# Patient Record
Sex: Male | Born: 1985 | Race: Black or African American | Hispanic: No | Marital: Married | Smoking: Light tobacco smoker
Health system: Southern US, Community
[De-identification: ages and names within clinical notes are randomized; demographics above are authoritative.]

---
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
## 2006-12-10 ENCOUNTER — Emergency Department (HOSPITAL_COMMUNITY): Admission: EM | Admit: 2006-12-10 | Discharge: 2006-12-10 | Payer: Self-pay | Admitting: Emergency Medicine

## 2011-07-09 DIAGNOSIS — E559 Vitamin D deficiency, unspecified: Secondary | ICD-10-CM | POA: Insufficient documentation

## 2013-04-28 ENCOUNTER — Ambulatory Visit: Payer: Self-pay | Admitting: Family Medicine

## 2013-04-28 VITALS — BP 116/70 | HR 72 | Temp 98.5°F | Resp 16 | Ht 67.5 in | Wt 233.0 lb

## 2013-04-28 DIAGNOSIS — M545 Low back pain: Secondary | ICD-10-CM

## 2013-04-28 DIAGNOSIS — R51 Headache: Secondary | ICD-10-CM

## 2013-04-28 DIAGNOSIS — Z0289 Encounter for other administrative examinations: Secondary | ICD-10-CM

## 2013-04-28 NOTE — Progress Notes (Signed)
[address]   [place], [place]  [address]   [phone]  Subjective:    Patient ID: Leonard French, male    DOB: 1985/10/04, 27 y.o.   MRN: [id number]  HPI This 27 y.o. male presents for evaluation for pre-employment CPE.  New job with [employer]; needs office notes from all providers.  Low back pain:  S/p ortho consultation; s/p MRI; evaluated by Dr. Shelle Iron; +spondylolisthesis.  No current back pain; complete recovery.  Last back pain in 07/2012.  No follow-up with [facility] recommended.  HA:  Followed by Dr. Neva Seat in the past.  Takes Ibuprofen sparingly for headaches.  Was previously getting headaches once per week; now no headaches.  Would take Ibuprofen two tablets with resolution of headache.  No blurred vision; no photophobia;no phonophobia.  No n/v.  No dizziness.     Review of Systems  Constitutional: Negative for fever, chills, diaphoresis and fatigue.  HENT: Negative for hearing loss and dental problem.   Respiratory: Negative for cough and shortness of breath.   Cardiovascular: Negative for chest pain, palpitations and leg swelling.  Gastrointestinal: Negative for nausea, vomiting, abdominal pain, diarrhea, constipation and rectal pain.  Endocrine: Negative for cold intolerance, heat intolerance, polydipsia, polyphagia and polyuria.  Genitourinary: Negative for frequency, flank pain, penile swelling, scrotal swelling and penile pain.  Musculoskeletal: Negative for myalgias, back pain, joint swelling, arthralgias and gait problem.  Skin: Negative for color change, pallor, rash and wound.  Neurological: Negative for dizziness, tremors, seizures, syncope, speech difficulty, weakness and headaches.  Hematological: Negative for adenopathy.  Psychiatric/Behavioral: Negative for sleep disturbance and dysphoric mood. The patient is not nervous/anxious.     History reviewed. No pertinent past medical history.  History reviewed. No pertinent past surgical history.  Prior to Admission  medications   Not on File    No Known Allergies  History   Social History  . Marital Status: Married    Spouse Name: N/A    Number of Children: N/A  . Years of Education: N/A   Occupational History  . Not on file.   Social History Main Topics  . Smoking status: Never Smoker   . Smokeless tobacco: Not on file  . Alcohol Use: Not on file  . Drug Use: Not on file  . Sexual Activity: Not on file   Other Topics Concern  . Not on file   Social History Narrative   Marital status: married      Children: two girls      Employment:  [employer]       Tobacco:  None      Alcohol: weekends; beer, wine, liquor.  No DWIs.      Drugs: none      Exercise:  Walking, running, weight training; 6 days per week.    Family History  Problem Relation Age of Onset  . Diabetes Maternal Grandmother        Objective:   Physical Exam  Nursing note and vitals reviewed. Constitutional: He is oriented to person, place, and time. He appears well-developed and well-nourished. No distress.  HENT:  Head: Normocephalic and atraumatic.  Right Ear: External ear normal.  Left Ear: External ear normal.  Nose: Nose normal.  Mouth/Throat: Oropharynx is clear and moist.  Eyes: Conjunctivae and EOM are normal. Pupils are equal, round, and reactive to light.  Neck: Normal range of motion. Neck supple. No thyromegaly present.  Cardiovascular: Normal rate, regular rhythm, normal heart sounds and intact distal pulses.  Exam reveals  no gallop and no friction rub.   No murmur heard. Pulmonary/Chest: Effort normal. He has no wheezes. He has no rales.  Abdominal: Soft. Bowel sounds are normal. He exhibits no distension and no mass. There is no tenderness. There is no rebound and no guarding.  Small R umbilical hernia reducible; non-tender.  Musculoskeletal:       Right shoulder: Normal.       Left shoulder: Normal.       Cervical back: Normal.       Thoracic back: Normal.       Lumbar back: Normal.    Lymphadenopathy:    He has no cervical adenopathy.  Neurological: He is alert and oriented to person, place, and time. He has normal reflexes. No cranial nerve deficit. He exhibits normal muscle tone. Coordination normal.  Skin: Skin is warm and dry. No rash noted. He is not diaphoretic. No erythema. No pallor.  Psychiatric: He has a normal mood and affect. His behavior is normal. Judgment and thought content normal.      Assessment & Plan:  Health examination of defined subpopulation  Headache(784.0)  Low back pain   1. Pre-employment CPE:  Clearance.  Normal vision, color vision, hearing.  Normal blood pressure.   2.  Low back pain: resolved; s/p workman's compensation injury 2013; s/p MRI; s/p ortho consultation with [facility]; clearance by ortho.  Normal back exam in office. 3.  Headaches Tension: Resolved; normal neurological exam.  Clearance for employment.

## 2013-05-07 ENCOUNTER — Telehealth: Payer: Self-pay

## 2013-05-07 NOTE — Telephone Encounter (Signed)
Pt was seen recently by dr Katrinka Blazing and was told that he had a hernia and his job is stating that they can not let him work until this has been evaluated and patient would like a call back to discuss this with dr Jason Nest number [phone]

## 2013-05-07 NOTE — Telephone Encounter (Signed)
Clearance to work was given, would this be a contraindication?  He is trying to gain employment at [employer]

## 2013-05-08 NOTE — Telephone Encounter (Signed)
Call -- hernia is not a contraindication to work; thus, I provided clearance.  I am happy to refer him to general surgery for further evaluation if he would like but I have no concerns with him starting employment despite hernia.

## 2013-05-09 NOTE — Telephone Encounter (Signed)
lmom to cb. 

## 2013-05-10 NOTE — Telephone Encounter (Signed)
Pt has appt set up with a general surgery already to have this evaluated.

## 2013-05-13 ENCOUNTER — Encounter (INDEPENDENT_AMBULATORY_CARE_PROVIDER_SITE_OTHER): Payer: Self-pay

## 2013-05-13 ENCOUNTER — Encounter (INDEPENDENT_AMBULATORY_CARE_PROVIDER_SITE_OTHER): Payer: Self-pay | Admitting: General Surgery

## 2013-05-13 ENCOUNTER — Ambulatory Visit (INDEPENDENT_AMBULATORY_CARE_PROVIDER_SITE_OTHER): Payer: Self-pay | Admitting: General Surgery

## 2013-05-13 VITALS — BP 138/88 | HR 80 | Temp 97.9°F | Resp 16 | Ht 66.0 in | Wt 236.4 lb

## 2013-05-13 DIAGNOSIS — K429 Umbilical hernia without obstruction or gangrene: Secondary | ICD-10-CM

## 2013-05-13 NOTE — Patient Instructions (Signed)
You have some asymmetry to the skin and subcutaneous tissue to the right of your umbilicus You technically have an umbilical hernia but it is VERY tiny and I donot recommend repair at this time Can continue normal activities Work on weight loss

## 2013-05-13 NOTE — Progress Notes (Signed)
Patient ID: Leonard French, male   DOB: [birth year], 27 y.o.   MRN: [id number]  Chief Complaint  Patient presents with  . Umbilical Hernia    new pt    HPI Leonard French is a 27 y.o. male.   HPI 27 yo AAM referred by Dr Nilda Simmer for evaluation of possible umbilical hernia. Patient saw Dr. Katrinka Blazing for a preemployment physical. On exam it was felt that he may have an umbilical hernia just to the right of the umbilicus. He denies any abdominal pain. He denies any fever, chills, nausea, vomiting, diarrhea or constipation. He denies ever noticing a protrusion or lump at his umbilicus. He has never had any prior abdominal surgery  History reviewed. No pertinent past medical history.  History reviewed. No pertinent past surgical history.  Family History  Problem Relation Age of Onset  . Diabetes Maternal Grandmother     Social History History  Substance Use Topics  . Smoking status: Never Smoker   . Smokeless tobacco: Not on file  . Alcohol Use: Yes    No Known Allergies  Current Outpatient Prescriptions  Medication Sig Dispense Refill  . sulfamethoxazole-trimethoprim (BACTRIM DS) 800-160 MG per tablet Take 1 tablet by mouth 2 (two) times daily.       No current facility-administered medications for this visit.    Review of Systems Review of Systems  Constitutional: Negative for fever, chills, appetite change and unexpected weight change.  HENT: Negative for congestion and trouble swallowing.        Takes a preventative abx b/c of h/o scalp infection  Eyes: Negative for visual disturbance.  Respiratory: Negative for chest tightness and shortness of breath.   Cardiovascular: Negative for chest pain and leg swelling.       No PND, no orthopnea, no DOE  Gastrointestinal: Negative for nausea, vomiting, abdominal pain, diarrhea and constipation.       See HPI  Genitourinary: Negative for dysuria and hematuria.  Musculoskeletal: Negative.   Skin: Negative for rash.  Neurological:  Negative for seizures and speech difficulty.  Hematological: Does not bruise/bleed easily.  Psychiatric/Behavioral: Negative for behavioral problems and confusion.    Blood pressure 138/88, pulse 80, temperature 97.9 F (36.6 C), temperature source Temporal, resp. rate 16, height 5\' 6"  (1.676 m), weight 236 lb 6.4 oz (107.23 kg).  Physical Exam Physical Exam  Vitals reviewed. Constitutional: He is oriented to person, place, and time. He appears well-developed and well-nourished. No distress.  obese  HENT:  Head: Normocephalic and atraumatic.  Right Ear: External ear normal.  Left Ear: External ear normal.  Eyes: Conjunctivae are normal. No scleral icterus.  Neck: Normal range of motion. Neck supple. No tracheal deviation present. No thyromegaly present.  Cardiovascular: Normal rate, normal heart sounds and intact distal pulses.   Pulmonary/Chest: Effort normal and breath sounds normal. No respiratory distress. He has no wheezes.  Abdominal: Soft. He exhibits no distension. There is no tenderness. There is no rebound and no guarding.  Has a slight asymmetry just to right of umbilicus in subcu tissue; no hernia or fascial defect in this area. Has about a 3-74mm fascial defect at base of umbilical stalk but nothing protruding. Nontender. Examined supine and standing.   Musculoskeletal: Normal range of motion. He exhibits no edema and no tenderness.  Lymphadenopathy:    He has no cervical adenopathy.  Neurological: He is alert and oriented to person, place, and time. He exhibits normal muscle tone.  Skin: Skin is warm and dry. No  rash noted. He is not diaphoretic. No erythema. No pallor.  Psychiatric: He has a normal mood and affect. His behavior is normal. Judgment and thought content normal.    Data Reviewed Dr Nilda Simmer note  Assessment    Small umbilical hernia     Plan    To the right of his umbilicus he has a small amount of asymmetry in the skin and subcutaneous tissue  however there is no fascial defect in this area. The patient was examined standing and supine. He does have a very very small fascial defect at the base of his umbilical stalk. However the defect is only about 3-4 mm. I do not recommend repair since it is so small and he is asymptomatic. However we did talk about the importance of weight loss. I do not believe this tiny fascial defect will impose any restrictions for him. Followup as needed  Mary Sella. Andrey Campanile, MD, FACS General, Bariatric, & Minimally Invasive Surgery [facility], [place]        [facility] M 05/13/2013, 9:23 AM

## 2013-05-19 ENCOUNTER — Telehealth (INDEPENDENT_AMBULATORY_CARE_PROVIDER_SITE_OTHER): Payer: Self-pay | Admitting: *Deleted

## 2013-05-19 NOTE — Telephone Encounter (Signed)
Patient called asking to speak with Leonard French only.  Explained that I would send her a message and ask her to return his call.  Patient states understanding.

## 2013-05-19 NOTE — Telephone Encounter (Signed)
i spoke with pt about because he insisted on speaking with me when in fact it was [employer]. Who he had been speaking with about forms..he did not have surgery but paid $20 for some forms that EW just needs to check off if pt can perform the job duties or not

## 2014-06-25 ENCOUNTER — Ambulatory Visit (INDEPENDENT_AMBULATORY_CARE_PROVIDER_SITE_OTHER): Payer: Federal, State, Local not specified - PPO | Admitting: Physician Assistant

## 2014-06-25 VITALS — BP 110/58 | HR 96 | Temp 101.0°F | Resp 19 | Ht 67.0 in | Wt 206.2 lb

## 2014-06-25 DIAGNOSIS — R509 Fever, unspecified: Secondary | ICD-10-CM

## 2014-06-25 DIAGNOSIS — R05 Cough: Secondary | ICD-10-CM

## 2014-06-25 DIAGNOSIS — R059 Cough, unspecified: Secondary | ICD-10-CM

## 2014-06-25 MED ORDER — ALBUTEROL SULFATE HFA 108 (90 BASE) MCG/ACT IN AERS: 2.0000 | INHALATION_SPRAY | RESPIRATORY_TRACT | Status: DC | PRN

## 2014-06-25 MED ORDER — AZITHROMYCIN 250 MG PO TABS: ORAL_TABLET | ORAL | Status: DC

## 2014-06-25 MED ORDER — HYDROCOD POLST-CHLORPHEN POLST 10-8 MG/5ML PO LQCR: 5.0000 mL | Freq: Two times a day (BID) | ORAL | Status: DC | PRN

## 2014-06-25 NOTE — Progress Notes (Signed)
Subjective:    Patient ID: Leonard French, male    DOB: [birth year], 28 y.o.   MRN: [id number] There are no active problems to display for this patient.  Prior to Admission medications   Medication Sig Start Date End Date Taking? Authorizing Provider  Dextromethorphan-Guaifenesin [facility](MUCINEX DM PO) Take by mouth.   Yes Historical Provider, MD  guaiFENesin (ROBITUSSIN) 100 MG/5ML liquid Take 200 mg by mouth 3 (three) times daily as needed for cough.   Yes Historical Provider, MD   No Known Allergies  HPI  This is a 28 year old male presenting with 10 days of fever, chills, cough and rhinorrhea. He initially had a sore throat but this has resolved. He reports his cough is productive and he is having a hard time sleeping because of coughing. He has continued to work through his illness. He has tried tylenol, mucinex and robitussin DM all without much relief. His last dose of tylenol was over 24 hours ago. He denies otalgia, sinus pressure, myalgias, wheezing or SOB. He is not a smoker and does not have a history of asthma. He has not had the flu vaccine this year.  Patient's social and family history were reviewed.  Review of Systems  Constitutional: Positive for fever and chills.  HENT: Positive for rhinorrhea and sore throat. Negative for ear pain and sinus pressure.   Eyes: Negative.   Respiratory: Positive for cough. Negative for shortness of breath and wheezing.   Gastrointestinal: Negative for nausea, vomiting and diarrhea.  Musculoskeletal: Negative for myalgias.  Skin: Negative for rash.  Neurological: Negative for dizziness.      Objective:   Physical Exam  Constitutional: He is oriented to person, place, and time. He appears well-developed and well-nourished. No distress.  HENT:  Head: Normocephalic and atraumatic.  Right Ear: Hearing, tympanic membrane, external ear and ear canal normal.  Left Ear: Hearing, tympanic membrane, external ear and ear canal normal.  Nose: Mucosal  edema present. Right sinus exhibits no maxillary sinus tenderness and no frontal sinus tenderness. Left sinus exhibits no maxillary sinus tenderness and no frontal sinus tenderness.  Mouth/Throat: Uvula is midline and mucous membranes are normal. Posterior oropharyngeal erythema present. No oropharyngeal exudate.  Eyes: Conjunctivae and lids are normal. Right eye exhibits no discharge. Left eye exhibits no discharge. No scleral icterus.  Cardiovascular: Normal rate, regular rhythm and normal pulses.   s2 splitting  Pulmonary/Chest: Effort normal. No respiratory distress. He has wheezes (inspiratory, all fields). He has no rhonchi. He has no rales.  Musculoskeletal: Normal range of motion.  Lymphadenopathy:       Head (right side): No submental, no submandibular, no tonsillar, no preauricular, no posterior auricular and no occipital adenopathy present.       Head (left side): No submental, no submandibular, no tonsillar, no preauricular, no posterior auricular and no occipital adenopathy present.    He has no cervical adenopathy.  Neurological: He is alert and oriented to person, place, and time.  Skin: Skin is warm, dry and intact. No lesion and no rash noted.  Psychiatric: He has a normal mood and affect. His speech is normal and behavior is normal. Thought content normal.   BP 110/58 mmHg  Pulse 96  Temp(Src) 101 F (38.3 C) (Oral)  Resp 19  Ht 5\' 7"  (1.702 m)  Wt 206 lb 3.2 oz (93.532 kg)  BMI 32.29 kg/m2  SpO2 96%     Assessment & Plan:  1. Cough 2. Fever, unspecified fever cause Due  to productive cough and fever x 10 days (fever today of 101) will treat for bacterial cause with zithromax. Due to wheezing, will give albuterol - counseling on medication given. He may take tussionex for cough suppression and sleep. He will call if still having fevers in 3 days.  - chlorpheniramine-HYDROcodone (TUSSIONEX PENNKINETIC ER) 10-8 MG/5ML LQCR; Take 5 mLs by mouth every 12 (twelve) hours as  needed for cough (cough).  Dispense: 100 mL; Refill: 0 - albuterol (PROVENTIL HFA;VENTOLIN HFA) 108 (90 BASE) MCG/ACT inhaler; Inhale 2 puffs into the lungs every 4 (four) hours as needed for wheezing or shortness of breath (cough, shortness of breath or wheezing.).  Dispense: 1 Inhaler; Refill: 1 - azithromycin (ZITHROMAX) 250 MG tablet; Take 2 tabs PO x 1 dose, then 1 tab PO QD x 4 days  Dispense: 6 tablet; Refill: 0   Roswell MinersNicole V. Dyke BrackettBush, PA-C, MHS [facility]  06/25/2014

## 2014-06-25 NOTE — Patient Instructions (Signed)
Drink plenty of fluids and get plenty of rest. If still having fevers in 3 days, call the office. If other cold symptoms not improving in 7 days, return to clinic.

## 2014-06-26 NOTE — Progress Notes (Signed)
I was directly involved with the patient's care and agree with the diagnosis and treatment plan.  

## 2015-09-21 ENCOUNTER — Ambulatory Visit (INDEPENDENT_AMBULATORY_CARE_PROVIDER_SITE_OTHER): Payer: Self-pay | Admitting: Family Medicine

## 2015-09-21 VITALS — BP 118/80 | HR 85 | Temp 98.6°F | Resp 18 | Ht 68.0 in | Wt 228.0 lb

## 2015-09-21 DIAGNOSIS — Z025 Encounter for examination for participation in sport: Secondary | ICD-10-CM

## 2015-09-21 DIAGNOSIS — Z029 Encounter for administrative examinations, unspecified: Secondary | ICD-10-CM

## 2015-09-21 NOTE — Patient Instructions (Signed)
No concerns on your exam today.  If any recurrence of wheezing or shortness of breath - return here or other medical provider.

## 2015-09-21 NOTE — Progress Notes (Addendum)
Subjective:  By signing my name below, I, Leonard French, attest that this documentation has been prepared under the direction and in the presence of Leonard Staggers, MD.  Leonard French, Medical Scribe. 09/21/2015.  8:33 AM.   Patient ID: Leonard French, male    DOB: [birth year], 30 y.o.   MRN: [id number]  Chief Complaint  Patient presents with  . Annual Exam    Has paperwork with him    HPI HPI Comments: Leonard French is a 30 y.o. male who presents to [facility] for a complete physical exam with associated paperwork for [facility] department.  Pt reports no medical problems, and indicates that he is currently on no medications. Pt was advised previously that he has severe allergies for which he was prescribed an albuterol inhaler in 2015, however he no longer is taking this. He notes no diagnosed history of asthma.  Pt has never been hospitalized previously. Pt denies shortness of breath with exacerbation.   Pt used to work with [employer], and the [employer] after. He is currently employed with [employer].   There are no active problems to display for this patient.  No past medical history on file. No past surgical history on file. No Known Allergies Prior to Admission medications   Not on File   Social History   Social History  . Marital Status: Married    Spouse Name: N/A  . Number of Children: N/A  . Years of Education: N/A   Occupational History  . Not on file.   Social History Main Topics  . Smoking status: Never Smoker   . Smokeless tobacco: Not on file  . Alcohol Use: Yes  . Drug Use: No  . Sexual Activity: Not on file   Other Topics Concern  . Not on file   Social History Narrative   Marital status: married      Children: two girls      Employment:  [employer]       Tobacco:  None      Alcohol: weekends; beer, wine, liquor.  No DWIs.      Drugs: none      Exercise:  Walking, running, weight  training; 6 days per week.    Review of Systems  Constitutional: Negative for fever.  Respiratory: Negative for shortness of breath.   Allergic/Immunologic: Positive for environmental allergies.   No acute concerns    Objective:   Physical Exam  Constitutional: He is oriented to person, place, and time. He appears well-developed and well-nourished. No distress.  HENT:  Head: Normocephalic and atraumatic.  Right Ear: External ear normal.  Left Ear: External ear normal.  Mouth/Throat: Oropharynx is clear and moist.  Eyes: Conjunctivae and EOM are normal. Pupils are equal, round, and reactive to light.  Neck: Normal range of motion. Neck supple. No thyromegaly present.  Cardiovascular: Normal rate, regular rhythm, normal heart sounds and intact distal pulses.   Pulmonary/Chest: Effort normal and breath sounds normal. No respiratory distress. He has no wheezes.  Abdominal: Soft. He exhibits no distension. There is no tenderness. Hernia confirmed negative in the right inguinal area and confirmed negative in the left inguinal area.  Musculoskeletal: Normal range of motion. He exhibits no edema or tenderness.  Lymphadenopathy:    He has no cervical adenopathy.  Neurological: He is alert and oriented to person, place, and time. He has normal reflexes. No cranial nerve deficit.  Skin: Skin is warm and  dry.  Psychiatric: He has a normal mood and affect. His behavior is normal.  Nursing note and vitals reviewed.   Filed Vitals:   09/21/15 0812  BP: 118/80  Pulse: 85  Temp: 98.6 F (37 C)  TempSrc: Oral  Resp: 18  Height:  (1.727 m)  Weight: 228 lb (103.42 kg)  SpO2: 98%      Assessment & Plan:  Leonard French is a 30 y.o. 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 No concerns on exams or hx.  Reactive bronchospasm prior, but no hx of asthma.  rtc precautions if wheezing returns.   -ppwk completed.   -declined flu vaccine.   No orders of the defined types were  placed in this encounter.   Patient Instructions  No concerns on your exam today.  If any recurrence of wheezing or shortness of breath - return here or other medical provider.     I personally performed the services described in this documentation, which was scribed in my presence. The recorded information has been reviewed and considered, and addended by me as needed.

## 2015-12-11 ENCOUNTER — Ambulatory Visit (INDEPENDENT_AMBULATORY_CARE_PROVIDER_SITE_OTHER): Payer: Self-pay | Admitting: Family Medicine

## 2015-12-11 VITALS — BP 132/78 | HR 88 | Temp 98.6°F | Resp 16 | Ht 67.25 in | Wt 232.4 lb

## 2015-12-11 DIAGNOSIS — Z0000 Encounter for general adult medical examination without abnormal findings: Secondary | ICD-10-CM

## 2015-12-11 DIAGNOSIS — Z029 Encounter for administrative examinations, unspecified: Secondary | ICD-10-CM

## 2015-12-11 LAB — POCT URINALYSIS DIP (MANUAL ENTRY)
Bilirubin, UA: NEGATIVE
Blood, UA: NEGATIVE
Glucose, UA: NEGATIVE
Ketones, POC UA: NEGATIVE
Leukocytes, UA: NEGATIVE
Nitrite, UA: NEGATIVE
Spec Grav, UA: 1.02
Urobilinogen, UA: 0.2
pH, UA: 6

## 2015-12-11 NOTE — Progress Notes (Signed)
30 year old man who has worked in [employer] and now is looking for job in [employer] and [employer].  His only medical issue is seasonal allergies and he takes over-the-counter medicine for that.  Objective: Cc forms. Other than being overweight, patient has no physical abnormalities  Assessment: Patient passes his screening for work in the [employer] Annual physical exam - Plan: POCT urinalysis dipstick, TB Skin Test   Signed, Sheila OatsKurt Jarman Litton M.D.

## 2015-12-11 NOTE — Patient Instructions (Addendum)

## 2015-12-11 NOTE — Progress Notes (Signed)
  Tuberculosis Risk Questionnaire  1. No Were you born outside the [place] in one of the following parts of the world: [place], [place], [place], [place] or [place]?    2. No Have you traveled outside the [place] and lived for more than one month in one of the following parts of the world: [place], [place], [place], [place] or [place]?    3. No Do you have a compromised immune system such as from any of the following conditions:HIV/AIDS, organ or bone marrow transplantation, diabetes, immunosuppressive medicines (e.g. Prednisone, Remicaide), leukemia, lymphoma, cancer of the head or neck, gastrectomy or jejunal bypass, end-stage renal disease (on dialysis), or silicosis?     4. No Have you ever or do you plan on working in: a residential care center, a health care facility, a jail or prison or homeless shelter?    5. No Have you ever: injected illegal drugs, used crack cocaine, lived in a homeless shelter  or been in jail or prison?     6. No Have you ever been exposed to anyone with infectious tuberculosis?    Tuberculosis Symptom Questionnaire  Do you currently have any of the following symptoms?  1. No Unexplained cough lasting more than 3 weeks?   2. No Unexplained fever lasting more than 3 weeks.   3. No Night Sweats (sweating that leaves the bedclothes and sheets wet)     4. No Shortness of Breath   5. No Chest Pain   6. No Unintentional weight loss    7. No Unexplained fatigue (very tired for no reason)    Elvina SidleKurt Amardeep Beckers, MD

## 2015-12-13 ENCOUNTER — Ambulatory Visit: Payer: Self-pay | Admitting: Physician Assistant

## 2015-12-13 LAB — TB SKIN TEST
Induration: 0 mm
TB Skin Test: NEGATIVE

## 2015-12-13 NOTE — Progress Notes (Unsigned)
Patient presented today from TB reading. TB reading was negative with 0mm duration. Jp./cma

## 2016-09-05 ENCOUNTER — Ambulatory Visit: Payer: Self-pay | Admitting: Family Medicine

## 2017-05-09 ENCOUNTER — Emergency Department (HOSPITAL_COMMUNITY)
Admission: EM | Admit: 2017-05-09 | Discharge: 2017-05-09 | Disposition: A | Discharge status: Home / Self Care | Payer: BLUE CROSS/BLUE SHIELD | Attending: Emergency Medicine | Admitting: Emergency Medicine

## 2017-05-09 ENCOUNTER — Encounter (HOSPITAL_COMMUNITY): Payer: Self-pay | Admitting: Emergency Medicine

## 2017-05-09 ENCOUNTER — Emergency Department (HOSPITAL_COMMUNITY): Payer: BLUE CROSS/BLUE SHIELD

## 2017-05-09 DIAGNOSIS — R0789 Other chest pain: Secondary | ICD-10-CM | POA: Diagnosis not present

## 2017-05-09 DIAGNOSIS — R1011 Right upper quadrant pain: Secondary | ICD-10-CM | POA: Insufficient documentation

## 2017-05-09 DIAGNOSIS — R0781 Pleurodynia: Secondary | ICD-10-CM

## 2017-05-09 LAB — COMPREHENSIVE METABOLIC PANEL
ALBUMIN: 4.5 g/dL (ref 3.5–5.0)
ALK PHOS: 50 U/L (ref 38–126)
ALT: 20 U/L (ref 17–63)
AST: 23 U/L (ref 15–41)
Anion gap: 14 (ref 5–15)
BILIRUBIN TOTAL: 1.7 mg/dL — AB (ref 0.3–1.2)
BUN: 10 mg/dL (ref 6–20)
CALCIUM: 9.5 mg/dL (ref 8.9–10.3)
CO2: 23 mmol/L (ref 22–32)
Chloride: 101 mmol/L (ref 101–111)
Creatinine, Ser: 0.81 mg/dL (ref 0.61–1.24)
GFR calc Af Amer: >60 mL/min (ref >60)
GFR calc non Af Amer: >60 mL/min (ref >60)
GLUCOSE: 92 mg/dL (ref 65–99)
Potassium: 3.9 mmol/L (ref 3.5–5.1)
SODIUM: 138 mmol/L (ref 135–145)
TOTAL PROTEIN: 8.3 g/dL — AB (ref 6.5–8.1)

## 2017-05-09 LAB — CBC
HEMATOCRIT: 43.4 % (ref 39.0–52.0)
Hemoglobin: 15.5 g/dL (ref 13.0–17.0)
MCH: 30.8 pg (ref 26.0–34.0)
MCHC: 35.7 g/dL (ref 30.0–36.0)
MCV: 86.3 fL (ref 78.0–100.0)
Platelets: 238 10*3/uL (ref 150–400)
RBC: 5.03 MIL/uL (ref 4.22–5.81)
RDW: 12.4 % (ref 11.5–15.5)
WBC: 10.1 10*3/uL (ref 4.0–10.5)

## 2017-05-09 LAB — LIPASE, BLOOD: LIPASE: 56 U/L — AB (ref 11–51)

## 2017-05-09 MED ORDER — ALBUTEROL SULFATE HFA 108 (90 BASE) MCG/ACT IN AERS: 2.0000 | INHALATION_SPRAY | RESPIRATORY_TRACT | 0 refills | Status: AC | PRN

## 2017-05-09 MED ORDER — IBUPROFEN 800 MG PO TABS: 800.0000 mg | ORAL_TABLET | Freq: Three times a day (TID) | ORAL | 0 refills | Status: AC

## 2017-05-09 MED ORDER — IBUPROFEN 800 MG PO TABS
800.0000 mg | ORAL_TABLET | Freq: Once | ORAL | Status: AC
  Administered 2017-05-09: 800 mg via ORAL
  Filled 2017-05-09: qty 1

## 2017-05-09 NOTE — Discharge Instructions (Signed)
You can take 800 mg of ibuprofen once every 8 hours with food to help with your pain. You can apply ice for the first 48 hours and then after that period you can apply heat to help with pain. The albuterol inhaler can taken with 2 puffs every 4 hours as needed for shortness of breath, just as were prescribed previously.   If you develop new or worsening symptoms, including fever, if the rash appears over the area, or if you develop nausea and vomiting with worsening pain, please return for re-evaluation.

## 2017-05-09 NOTE — ED Provider Notes (Signed)
[facility]-EMERGENCY DEPT Provider Note   CSN: [id number] Arrival date & time: 05/09/17  4098     History   Chief Complaint Chief Complaint  Patient presents with  . rib cage pain    HPI Leonard French is a 31 y.o. male who presents to the emergency department with a chief complaint of right upper quadrant pain and right antero lateral chest pain that radiates to the right side of his mid-back that began yesterday. He states his symptoms are aggravated with taking deep breaths, laying on his back, and twisting his torso. No alleviating factors. He denies chest pain, dyspnea, palpitations, fever, chills, nausea, vomiting, or diarrhea. No dysuria, frequency, or hesitancy. No recent trauma or injury. No history of similar symptoms. No treatment prior to arrival.  The history is provided by the patient. No language interpreter was used.    History reviewed. No pertinent past medical history.  There are no active problems to display for this patient.   History reviewed. No pertinent surgical history.     Home Medications    Prior to Admission medications   Medication Sig Start Date End Date Taking? Authorizing Provider  albuterol (PROVENTIL HFA;VENTOLIN HFA) 108 (90 Base) MCG/ACT inhaler Inhale 2 puffs into the lungs every 4 (four) hours as needed for wheezing or shortness of breath. 05/09/17   Kobe Jansma A, PA-C  ibuprofen (ADVIL,MOTRIN) 800 MG tablet Take 1 tablet (800 mg total) by mouth 3 (three) times daily. 05/09/17   Teja Costen A, PA-C    Family History Family History  Problem Relation Age of Onset  . Diabetes Maternal Grandmother     Social History Social History  Substance Use Topics  . Smoking status: Never Smoker  . Smokeless tobacco: Not on file  . Alcohol use Yes     Allergies   Patient has no known allergies.   Review of Systems Review of Systems  Constitutional: Negative for activity change, chills and fever.  Respiratory: Negative for shortness of  breath.   Cardiovascular: Positive for chest pain.  Gastrointestinal: Positive for abdominal pain. Negative for abdominal distention, diarrhea, nausea and vomiting.  Musculoskeletal: Negative for back pain.  Skin: Negative for rash.  Allergic/Immunologic: Negative for immunocompromised state.   Physical Exam Updated Vital Signs BP 132/81 (BP Location: Left Arm)   Pulse 72   Temp 98.5 F (36.9 C) (Oral)   Resp 18   SpO2 98%   Physical Exam  Constitutional: He appears well-developed.  HENT:  Head: Normocephalic.  Eyes: Conjunctivae are normal.  Neck: Neck supple.  Cardiovascular: Normal rate, regular rhythm and normal heart sounds.  Exam reveals no gallop and no friction rub.   No murmur heard. Pulmonary/Chest: Effort normal and breath sounds normal. No respiratory distress. He has no wheezes. He has no rales. He exhibits tenderness.  TTP over the right anterior and lateral chest wall. No focal tenderness to palpation of the ribs.   Abdominal: Soft. Bowel sounds are normal. He exhibits no distension and no mass. There is tenderness. There is no rebound and no guarding. No hernia.  TTP in the right upper quadrant. No CVA tenderness bilaterally. No RLQ or left-sided abdominal pain.   Musculoskeletal: He exhibits tenderness. He exhibits no edema or deformity.  TTP over the the right paraspinal muscles of the thoracic spine. No left-sided tenderness. No tenderness to palpation of the cervical, thoracic, or lumbar spinous processes.  Neurological: He is alert.  Skin: Skin is warm and dry.  Psychiatric: His behavior is  normal.  Nursing note and vitals reviewed.    ED Treatments / Results  Labs (all labs ordered are listed, but only abnormal results are displayed) Labs Reviewed  COMPREHENSIVE METABOLIC PANEL - Abnormal; Notable for the following:       Result Value   Total Protein 8.3 (*)    Total Bilirubin 1.7 (*)    All other components within normal limits  LIPASE, BLOOD -  Abnormal; Notable for the following:    Lipase 56 (*)    All other components within normal limits  CBC    EKG  EKG Interpretation None       Radiology Dg Chest 2 View  Result Date: 05/09/2017 CLINICAL DATA:  31 year old male with a history of lower anterior rib pain EXAM: CHEST  2 VIEW COMPARISON:  None. FINDINGS: Cardiomediastinal silhouette within normal limits. No evidence of central vascular congestion. Low lung volumes with crowding of the interstitium. No pleural effusion or pneumothorax. No acute fracture. IMPRESSION: Low lung volumes with no evidence of acute cardiopulmonary disease Electronically Signed   By: Gilmer Mor D.O.   On: 05/09/2017 10:04    Procedures Procedures (including critical care time)  Medications Ordered in ED Medications  ibuprofen (ADVIL,MOTRIN) tablet 800 mg (800 mg Oral Given 05/09/17 1130)     Initial Impression / Assessment and Plan / ED Course  I have reviewed the triage vital signs and the nursing notes.  Pertinent labs & imaging results that were available during my care of the patient were reviewed by me and considered in my medical decision making (see chart for details).     31 year old male presenting with atraumatic right-sided anterolateral chest pain/RUQ abdominal pain that radiates to the right back. No N/V, constitutional symptoms, dyspnea, or palpitations. Chest x-ray w/ low lung volumes with crowding of the interstitium. Lungs are CTAB. Lipase mildly elevated at 56. CBC and his CMP are unremarkable. Discussed with the patient very strict return precautions, including if he develops a rash over the area or associated nausea and vomiting, particularly after eating a fatty meal. The patient requested a refill of an albuterol inhaler. Encouraged symptomatically treatment with anti-inflammatories, ice, and rest. Vital signs stable. No acute distress. The patient is safe for discharge at this time.  Final Clinical Impressions(s) / ED  Diagnoses   Final diagnoses:  Right upper quadrant pain  Rib pain on right side    New Prescriptions Discharge Medication List as of 05/09/2017 12:42 PM    START taking these medications   Details  albuterol (PROVENTIL HFA;VENTOLIN HFA) 108 (90 Base) MCG/ACT inhaler Inhale 2 puffs into the lungs every 4 (four) hours as needed for wheezing or shortness of breath., Starting Fri 05/09/2017, Print    ibuprofen (ADVIL,MOTRIN) 800 MG tablet Take 1 tablet (800 mg total) by mouth 3 (three) times daily., Starting Fri 05/09/2017, Print         Vimal Derego A, PA-C 05/09/17 1305    Lorre Nick, MD 05/12/17 [phone]

## 2017-05-09 NOTE — ED Triage Notes (Signed)
Per pt, states he woke up yesterday with right rib cage discomfort-states painful when he "twists and turns" come discomfort with deep inspiration

## 2017-05-16 ENCOUNTER — Encounter (HOSPITAL_COMMUNITY): Payer: Self-pay

## 2017-05-16 ENCOUNTER — Other Ambulatory Visit: Payer: Self-pay

## 2017-05-16 ENCOUNTER — Emergency Department (HOSPITAL_COMMUNITY)
Admission: EM | Admit: 2017-05-16 | Discharge: 2017-05-16 | Disposition: A | Discharge status: Home / Self Care | Payer: BLUE CROSS/BLUE SHIELD | Attending: Emergency Medicine | Admitting: Emergency Medicine

## 2017-05-16 DIAGNOSIS — Z5321 Procedure and treatment not carried out due to patient leaving prior to being seen by health care provider: Secondary | ICD-10-CM | POA: Insufficient documentation

## 2017-05-16 DIAGNOSIS — R1011 Right upper quadrant pain: Secondary | ICD-10-CM | POA: Diagnosis not present

## 2017-05-16 NOTE — ED Triage Notes (Signed)
Patient reports that he was seen a week ago and was told he had inflammation of his pancreas. Patient states he was fine after taking medication. Patient also states he works where he has to wear a tight vest and was having increased pain to the same area, but no pain today.

## 2017-05-21 ENCOUNTER — Ambulatory Visit (INDEPENDENT_AMBULATORY_CARE_PROVIDER_SITE_OTHER): Payer: BLUE CROSS/BLUE SHIELD | Admitting: Family Medicine

## 2017-05-21 ENCOUNTER — Encounter: Payer: Self-pay | Admitting: Family Medicine

## 2017-05-21 VITALS — BP 134/86 | HR 96 | Temp 98.1°F | Ht 67.0 in | Wt 228.0 lb

## 2017-05-21 DIAGNOSIS — Z0000 Encounter for general adult medical examination without abnormal findings: Secondary | ICD-10-CM | POA: Diagnosis not present

## 2017-05-21 DIAGNOSIS — J4520 Mild intermittent asthma, uncomplicated: Secondary | ICD-10-CM | POA: Diagnosis not present

## 2017-05-21 NOTE — Patient Instructions (Signed)

## 2017-05-21 NOTE — Progress Notes (Addendum)
Subjective:    Patient ID: Leonard French, male    DOB: 01/16/1986, 31 y.o.   MRN: [id number]  HPI This is a 31 yo male who presents today to establish care. He requests CPE today. He is married with 7, 31 yo girls. He is a [employer] [place] [employer]. Just started in August. Enjoys spending time with his family, going to the shooting range.   Has history of asthma, has been using albuterol a couple of times a week recently for cough. Feels winded with exertion, wife states she hears wheezes. Requires albuterol seasonally, can go months without needing. Occasionally take OTC antihistamine.   Was seen in ER earlier this month with RUQ abdominal pain, resolved. Had labs at that time.   Last CPE- 12/11/15 PSA-NA Colonoscopy-NA Tdap- he is unsure Flu- declines Dental- is in process of establishing with dentist Eye- vision screening through work Exercise- not regular   No past medical history on file. No past surgical history on file. Family History  Problem Relation Age of Onset  . Diabetes Mother   . Diabetes Maternal Grandmother    Social History  Substance Use Topics  . Smoking status: Never Smoker  . Smokeless tobacco: Never Used  . Alcohol use Yes     Review of Systems  Constitutional: Negative for fatigue, fever and unexpected weight change.  HENT: Positive for rhinorrhea (intermittent).   Eyes: Negative.   Respiratory: Positive for cough and shortness of breath. Negative for chest tightness.   Cardiovascular: Negative for chest pain, palpitations and leg swelling.  Gastrointestinal: Negative for abdominal pain, constipation, diarrhea, nausea and vomiting.  Genitourinary: Negative for dysuria and hematuria.  Musculoskeletal: Negative.   Neurological: Negative for headaches.  Psychiatric/Behavioral: Negative for dysphoric mood.       Objective:   Physical Exam Physical Exam  Constitutional: He is oriented to person, place, and time. He appears well-developed  and well-nourished.  HENT:  Head: Normocephalic and atraumatic.  Right Ear: External ear normal.  Left Ear: External ear normal.  Nose: Nose normal.  Mouth/Throat: Oropharynx is clear and moist.  Eyes: Conjunctivae are normal. Pupils are equal, round, and reactive to light.  Neck: Normal range of motion. Neck supple.  Cardiovascular: Normal rate, regular rhythm, normal heart sounds and intact distal pulses.   Pulmonary/Chest: Effort normal and breath sounds normal.  Abdominal: Soft. Bowel sounds are normal.Genitourinary: Testes normal and penis normal. Circumcised.  Musculoskeletal: Normal range of motion. He exhibits no edema or tenderness.       Cervical back: Normal.       Thoracic back: Normal.       Lumbar back: Normal.  Lymphadenopathy:    He has no cervical adenopathy.  Neurological: He is alert and oriented to person, place, and time. He has normal reflexes.  Skin: Skin is warm and dry.  Psychiatric: He has a normal mood and affect. His behavior is normal. Judgment normal.  Vitals reviewed.   BP 134/86 (BP Location: Left Arm, Patient Position: Sitting, Cuff Size: Normal)   Pulse 96   Temp 98.1 F (36.7 C) (Oral)   Ht 5\' 7"  (1.702 m)   Wt 228 lb (103.4 kg)   SpO2 97%   BMI 35.71 kg/m  Wt Readings from Last 3 Encounters:  05/21/17 228 lb (103.4 kg)  05/16/17 220 lb (99.8 kg)  12/11/15 232 lb 6.4 oz (105.4 kg)   [facility] 05/21/2017 12/11/2015 09/21/2015  Decreased Interest 0 0 0  Down, Depressed,  Hopeless 0 0 0  PHQ - 2 Score 0 0 0        Assessment & Plan:  1. Annual physical exam - Discussed and encouraged healthy lifestyle choices- adequate sleep, regular exercise, stress management and healthy food choices.  - follow up in 1 year  2. Mild intermittent asthma without complication - discussed albuterol use and possible need for inhaled corticosteroid if requiring albuterol use more than 2 times a week - follow up PRN if high usage   Olean Ree, FNP-BC  [facility], [facility]  05/23/2017 2:17 PM

## 2017-05-23 DIAGNOSIS — J4520 Mild intermittent asthma, uncomplicated: Secondary | ICD-10-CM | POA: Insufficient documentation

## 2017-05-29 ENCOUNTER — Ambulatory Visit: Payer: Self-pay | Admitting: *Deleted

## 2017-05-29 NOTE — Telephone Encounter (Signed)
Patient does not want to go to Urgent Care - wants to see provider in am. Appointment given

## 2017-05-29 NOTE — Telephone Encounter (Signed)
Do not have any more available work in appt today can you contact pt to see if they will be seen at another [facility] site or go to UC?

## 2017-05-29 NOTE — Telephone Encounter (Signed)
   Reason for Disposition . [1] MILD or MODERATE vomiting AND [2] present > 48 hours (2 days) (Exception: mild vomiting with associated diarrhea)  Answer Assessment - Initial Assessment Questions 1. VOMITING SEVERITY: "How many times have you vomited in the past 24 hours?"     - MILD:  1 - 2 times/day    - MODERATE: 3 - 5 times/day, decreased oral intake without significant weight loss or symptoms of dehydration    - SEVERE: 6 or more times/day, vomits everything or nearly everything, with significant weight loss, symptoms of dehydration      1 2. ONSET: "When did the vomiting begin?"      Sunday- 4 days ago 3. FLUIDS: "What fluids or food have you vomited up today?" "Have you been able to keep any fluids down?"     Able to keep fluids down - can't keep solids down 4. ABDOMINAL PAIN: "Are your having any abdominal pain?" If yes : "How bad is it and what does it feel like?" (e.g., crampy, dull, intermittent, constant)      no 5. DIARRHEA: "Is there any diarrhea?" If so, ask: "How many times today?"      Sunday- loose stool  Nothing since 6. CONTACTS: "Is there anyone else in the family with the same symptoms?"      no 7. CAUSE: "What do you think is causing your vomiting?"     unknown 8. HYDRATION STATUS: "Any signs of dehydration?" (e.g., dry mouth [not only dry lips], too weak to stand) "When did you last urinate?"     No- is urinate 9. OTHER SYMPTOMS: "Do you have any other symptoms?" (e.g., fever, headache, vertigo, vomiting blood or coffee grounds, recent head injury)     Patient has had headache some weakness 10. PREGNANCY: "Is there any chance you are pregnant?" "When was your last menstrual period?"       n/a  Protocols used: [facility]

## 2017-05-30 ENCOUNTER — Encounter: Payer: Self-pay | Admitting: Family Medicine

## 2017-05-30 ENCOUNTER — Ambulatory Visit (INDEPENDENT_AMBULATORY_CARE_PROVIDER_SITE_OTHER): Payer: BLUE CROSS/BLUE SHIELD | Admitting: Family Medicine

## 2017-05-30 VITALS — BP 110/66 | HR 86 | Temp 98.3°F | Ht 67.0 in | Wt 231.0 lb

## 2017-05-30 DIAGNOSIS — R112 Nausea with vomiting, unspecified: Secondary | ICD-10-CM

## 2017-05-30 MED ORDER — ONDANSETRON HCL 4 MG PO TABS: 4.0000 mg | ORAL_TABLET | Freq: Three times a day (TID) | ORAL | 0 refills | Status: AC | PRN

## 2017-05-30 NOTE — Patient Instructions (Signed)

## 2017-05-30 NOTE — Progress Notes (Signed)
   Subjective:    Patient ID: Leonard French, male    DOB: 10/22/1985, 31 y.o.   MRN: [id number]  HPI This is a 31 yo male who presents today with Had vomiting 6 days ago. Had a day of no vomiting, when he didn't eat. Tried to resume eating (salad, chicken) and had more vomiting. Had some loose stools on first day. Had some abdominal pain at onset, none now. Has been hot, has not checked temperature. Able to keep down fluids. Last night ate grilled chicken and crackers and did ok. No sick contacts. Last BM yesterday. No vomiting for more than 24 hours, still feels a little nauseous.  No urinary frequency, no dysuria, no nocturia.   No past medical history on file. No past surgical history on file. Family History  Problem Relation Age of Onset  . Diabetes Mother   . Diabetes Maternal Grandmother    Social History  Substance Use Topics  . Smoking status: Never Smoker  . Smokeless tobacco: Never Used  . Alcohol use Yes      Review of Systems Per HPI    Objective:   Physical Exam  Constitutional: He appears well-developed and well-nourished. No distress.  HENT:  Head: Normocephalic and atraumatic.  Cardiovascular: Normal rate, regular rhythm and normal heart sounds.   Pulmonary/Chest: Effort normal and breath sounds normal.  Abdominal: Soft. Bowel sounds are normal. He exhibits no distension. There is no tenderness. There is no rebound and no guarding.  Skin: Skin is warm and dry. He is not diaphoretic.  Psychiatric: He has a normal mood and affect. His behavior is normal. Judgment and thought content normal.  Vitals reviewed.     BP 110/66 (BP Location: Left Arm, Patient Position: Sitting, Cuff Size: Large)   Pulse 86   Temp 98.3 F (36.8 C) (Oral)   Ht 5\' 7"  (1.702 m)   Wt 231 lb (104.8 kg)   SpO2 97%   BMI 36.18 kg/m  Wt Readings from Last 3 Encounters:  05/30/17 231 lb (104.8 kg)  05/21/17 228 lb (103.4 kg)  05/16/17 220 lb (99.8 kg)       Assessment & Plan:    1. Non-intractable vomiting with nausea, unspecified vomiting type - suspect gastroenteritis, resolving, provided information regarding bland diet and discussed RTC precautions. - ondansetron (ZOFRAN) 4 MG tablet; Take 1 tablet (4 mg total) by mouth every 8 (eight) hours as needed for nausea or vomiting.  Dispense: 10 tablet; Refill: 0   Olean Reeeborah Temara Lanum, FNP-BC  [facility], [facility]  05/30/2017 8:24 AM

## 2017-08-29 ENCOUNTER — Emergency Department (HOSPITAL_COMMUNITY): Payer: Self-pay

## 2017-08-29 ENCOUNTER — Emergency Department (HOSPITAL_COMMUNITY)
Admission: EM | Admit: 2017-08-29 | Discharge: 2017-08-29 | Disposition: A | Discharge status: Home / Self Care | Payer: Self-pay | Attending: Emergency Medicine | Admitting: Emergency Medicine

## 2017-08-29 ENCOUNTER — Encounter (HOSPITAL_COMMUNITY): Payer: Self-pay | Admitting: Emergency Medicine

## 2017-08-29 DIAGNOSIS — J40 Bronchitis, not specified as acute or chronic: Secondary | ICD-10-CM

## 2017-08-29 DIAGNOSIS — F17290 Nicotine dependence, other tobacco product, uncomplicated: Secondary | ICD-10-CM | POA: Insufficient documentation

## 2017-08-29 DIAGNOSIS — J45909 Unspecified asthma, uncomplicated: Secondary | ICD-10-CM | POA: Insufficient documentation

## 2017-08-29 DIAGNOSIS — J209 Acute bronchitis, unspecified: Secondary | ICD-10-CM | POA: Insufficient documentation

## 2017-08-29 MED ORDER — ALBUTEROL SULFATE HFA 108 (90 BASE) MCG/ACT IN AERS
2.0000 | INHALATION_SPRAY | RESPIRATORY_TRACT | Status: DC | PRN
  Administered 2017-08-29: 2 via RESPIRATORY_TRACT
  Filled 2017-08-29: qty 6.7

## 2017-08-29 NOTE — ED Triage Notes (Signed)
Patient reports that he started working at [employer] beginning of Dec. Reports after 2 weeks developed a cough that is dry. Reports that last couple days when coughing feels like he is choking. Reports using his wife albuterol inhaler last night which helped.

## 2017-08-29 NOTE — Discharge Instructions (Signed)
Your chest xray shows bronchitis.  Please use albuterol inhaler for cough.  Your blood pressure was elevated in the ER today, please have this rechecked at your primary doctor's office.  Return to the ER if you have shortness of breath that does not improve with albuterol inhaler, fever greater than 100.4 F with cough or have any new or worsening symptoms.

## 2017-08-29 NOTE — ED Provider Notes (Signed)
[facility]-EMERGENCY DEPT Provider Note   CSN: [id number] Arrival date & time: 08/29/17  0906     History   Chief Complaint Chief Complaint  Patient presents with  . Cough    HPI Leonard French is a 32 y.o. male.  HPI   Leonard French is a 32yo male with a history of allergic rhinitis who presents to the emergency department for evaluation of cough and wheezing.  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 Shortly after starting work, patient noticed that he had a dry cough.  States that the cough would improve when he went home and on days off.  Reports that he has never formally been diagnosed with asthma, although has been prescribed an albuterol inhaler for wheezing with allergies in the past.  States that last night a cough woke him from sleep and he felt like he was choking.  He was wheezing and could not catch his breath.  He had immediate improvement in his breathing after his wife handed him her albuterol inhaler. He currently denies any shortness of breath, wheezing or chest pain.  Denies associated fever, chills, sore throat, ear pain, congestion, abdominal pain, nausea/vomiting.   History reviewed. No pertinent past medical history.  Patient Active Problem List   Diagnosis Date Noted  . Mild intermittent asthma without complication 05/23/2017  . Vitamin D deficiency 07/09/2011    History reviewed. No pertinent surgical history.     Home Medications    Prior to Admission medications   Medication Sig Start Date End Date Taking? Authorizing Provider  albuterol (PROVENTIL HFA;VENTOLIN HFA) 108 (90 Base) MCG/ACT inhaler Inhale 2 puffs into the lungs every 4 (four) hours as needed for wheezing or shortness of breath. 05/09/17  Yes McDonald, Mia A, PA-C  ibuprofen (ADVIL,MOTRIN) 800 MG tablet Take 1 tablet (800 mg total) by mouth 3 (three) times  daily. Patient not taking: Reported on 08/29/2017 05/09/17   McDonald, Mia A, PA-C  ondansetron (ZOFRAN) 4 MG tablet Take 1 tablet (4 mg total) by mouth every 8 (eight) hours as needed for nausea or vomiting. Patient not taking: Reported on 08/29/2017 05/30/17   Emi Belfast, FNP    Family History Family History  Problem Relation Age of Onset  . Diabetes Mother   . Diabetes Maternal Grandmother     Social History Social History   Tobacco Use  . Smoking status: Light Tobacco Smoker    Types: Cigars  . Smokeless tobacco: Never Used  Substance Use Topics  . Alcohol use: Yes  . Drug use: No     Allergies   Patient has no known allergies.   Review of Systems Review of Systems  Constitutional: Negative for chills, fatigue and fever.  HENT: Negative for congestion, rhinorrhea, sore throat and trouble swallowing.   Respiratory: Positive for cough, chest tightness, shortness of breath and wheezing.   Cardiovascular: Negative for chest pain and leg swelling.  Gastrointestinal: Negative for abdominal pain, nausea and vomiting.  Musculoskeletal: Negative for myalgias.     Physical Exam Updated Vital Signs BP (!) 149/85 (BP Location: Right Arm)   Pulse 90   Temp 98.2 F (36.8 C) (Oral)   Resp 16   SpO2 100%   Physical Exam  Constitutional: He is oriented to person, place, and time. He appears well-developed and well-nourished. No distress.  Sitting at bedside in no apparent distress.  HENT:  Head: Normocephalic and atraumatic.  Nose: Nose normal.  Mouth/Throat: Oropharynx is clear and moist. No oropharyngeal exudate.  No maxillary or frontal sinus tenderness.   Eyes: Conjunctivae are normal. Pupils are equal, round, and reactive to light. Right eye exhibits no discharge. Left eye exhibits no discharge.  Neck: Normal range of motion. Neck supple.  Cardiovascular: Normal rate, regular rhythm and intact distal pulses. Exam reveals no friction rub.  No murmur  heard. Pulmonary/Chest: Effort normal and breath sounds normal. No stridor. No respiratory distress. He has no wheezes. He has no rales.  O2 sat 100%RA. Speaking in full sentences. Lungs CTA.   Abdominal: Soft. Bowel sounds are normal. There is no tenderness. There is no guarding.  Lymphadenopathy:    He has no cervical adenopathy.  Neurological: He is alert and oriented to person, place, and time. Coordination normal.  Skin: Skin is warm and dry. Capillary refill takes less than 2 seconds. He is not diaphoretic.  Psychiatric: He has a normal mood and affect. His behavior is normal.  Nursing note and vitals reviewed.    ED Treatments / Results  Labs (all labs ordered are listed, but only abnormal results are displayed) Labs Reviewed - No data to display  EKG  EKG Interpretation None       Radiology Dg Chest 2 View  Result Date: 08/29/2017 CLINICAL DATA:  Nonproductive cough. EXAM: CHEST  2 VIEW COMPARISON:  05/09/2017 FINDINGS: The heart size and mediastinal contours are within normal limits. Both lungs are clear except for slight peribronchial thickening. The visualized skeletal structures are unremarkable. IMPRESSION: Mild bronchitic changes. Electronically Signed   By: Francene BoyersJames  Maxwell M.D.   On: 08/29/2017 11:04    Procedures Procedures (including critical care time)  Medications Ordered in ED Medications  albuterol (PROVENTIL HFA;VENTOLIN HFA) 108 (90 Base) MCG/ACT inhaler 2 puff (not administered)     Initial Impression / Assessment and Plan / ED Course  I have reviewed the triage vital signs and the nursing notes.  Pertinent labs & imaging results that were available during my care of the patient were reviewed by me and considered in my medical decision making (see chart for details).    He is in no respiratory distress, lungs clear to auscultation. CXR shows mild bronchitic changes. Consistent with presentation of dry cough. Will send home with albuterol inhaler to  take PRN. Hae counseled patient to follow up with his primary doctor given his ER visit today. His blood pressure was elevated in the ER today, counseled him to have this rechecked as well.  Discussed return precautions and patient agrees. He has no complaints prior to discharge.   Final Clinical Impressions(s) / ED Diagnoses   Final diagnoses:  Bronchitis    ED Discharge Orders    None       Lawrence MarseillesShrosbree, Klarisa Barman J, PA-C 08/30/17 [id number]    Gerhard MunchLockwood, Robert, MD 08/30/17 [phone]

## 2019-03-30 IMAGING — CR DG CHEST 2V
2 series · 2 of 2 positions shown · non-contrast
Comparison: 05/09/2017

CLINICAL DATA: Nonproductive cough.

EXAM:
CHEST  2 VIEW

[w chest pa]
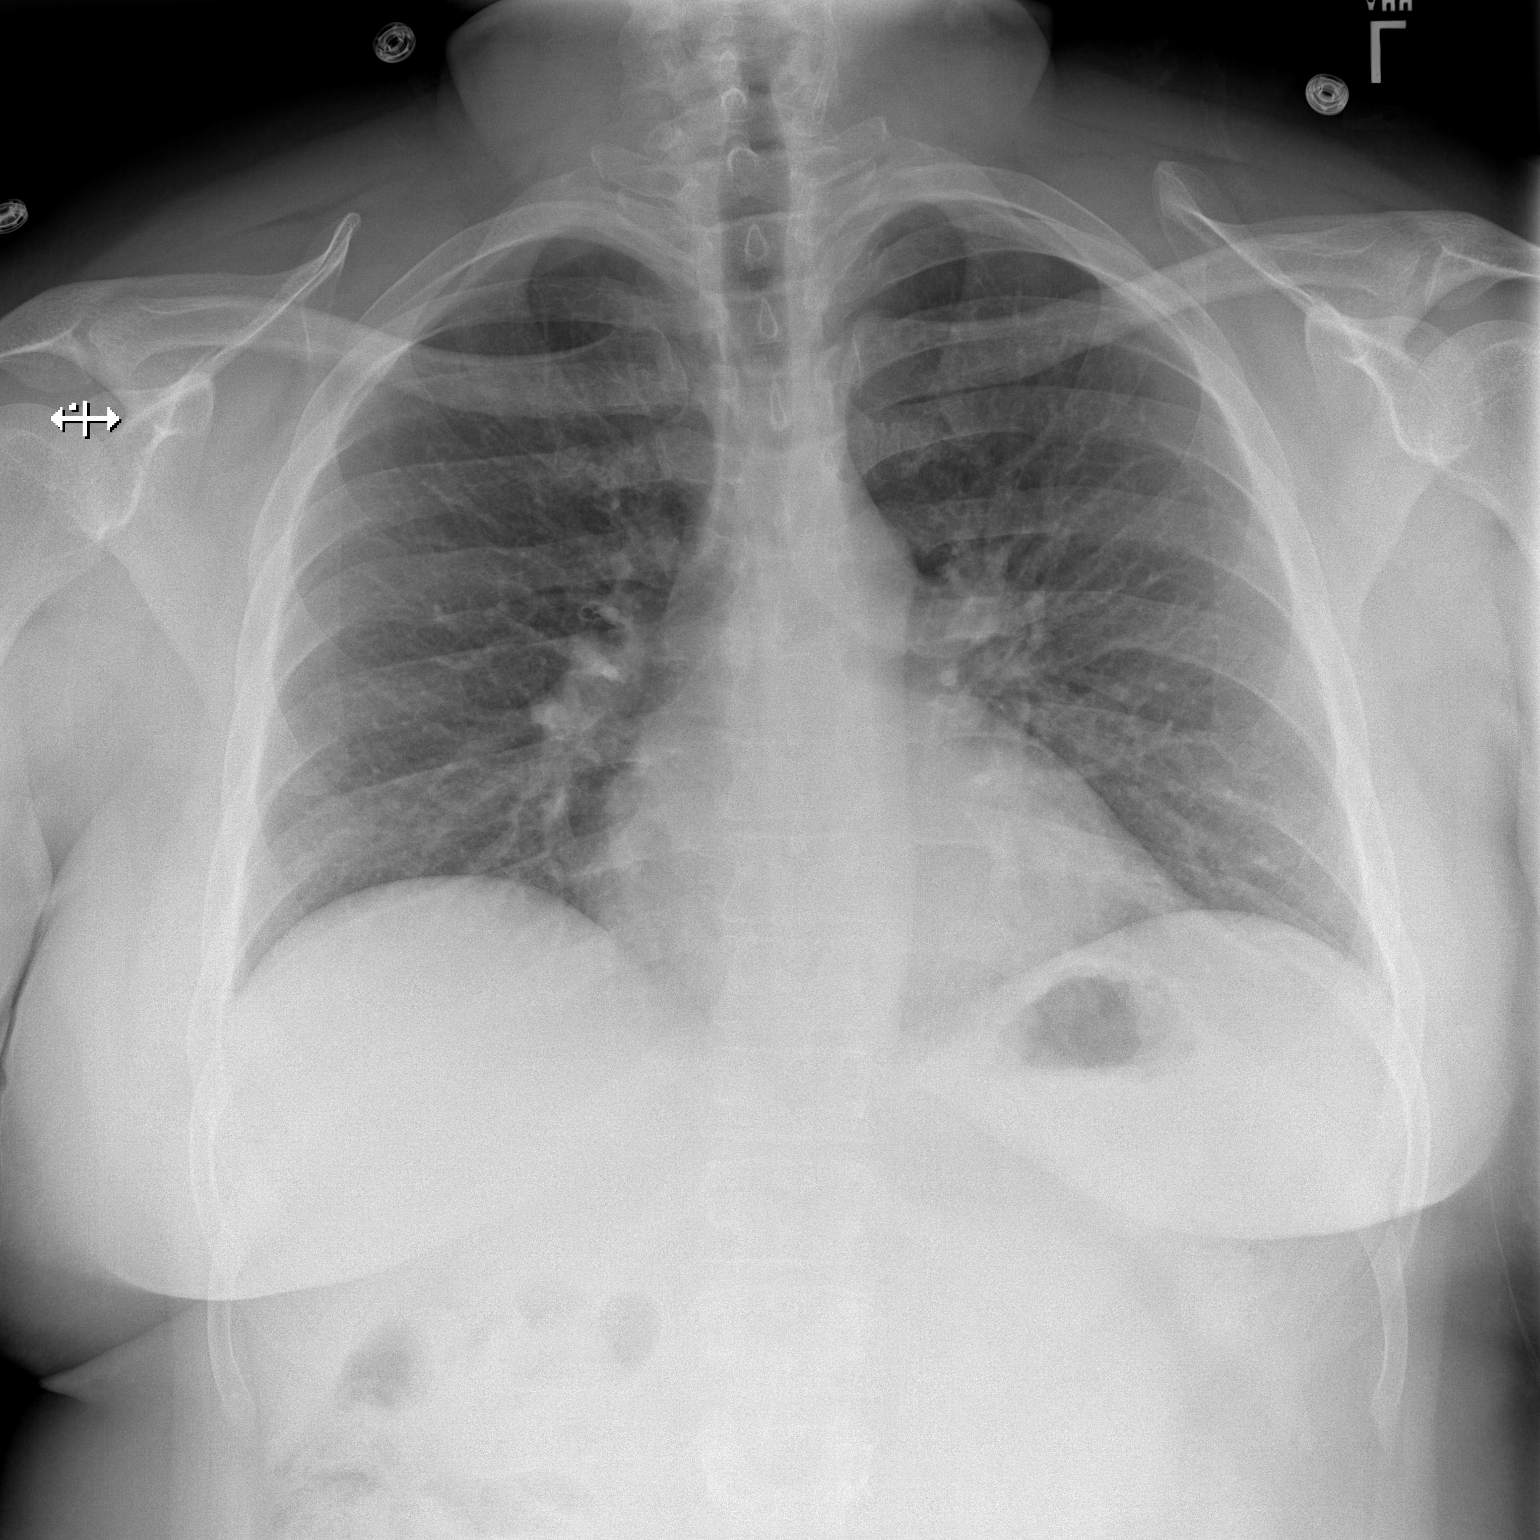

[w chest lat]
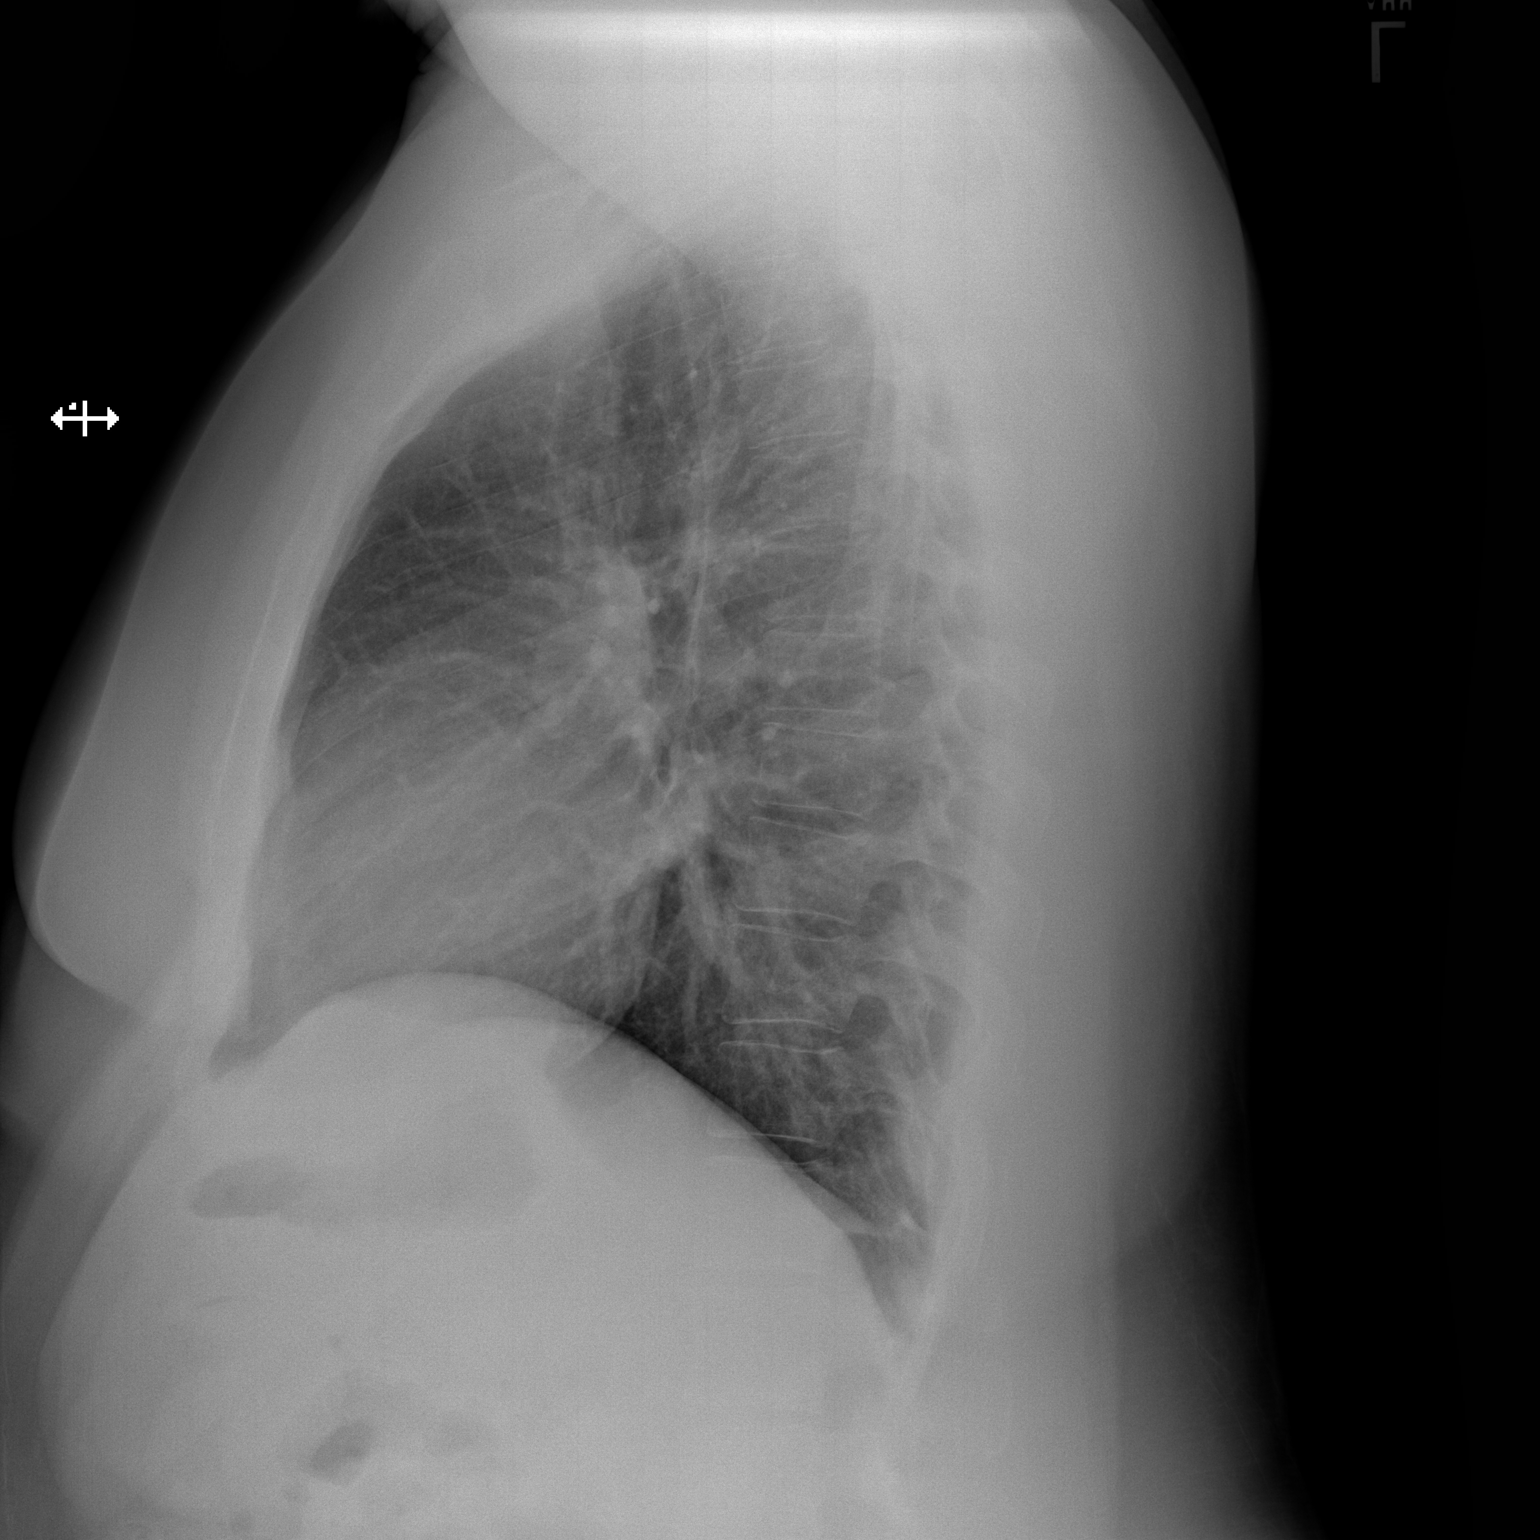

[2 of 2 positions shown; findings below may reference images not displayed]

FINDINGS: The heart size and mediastinal contours are within normal limits.
Both lungs are clear except for slight peribronchial thickening. The
visualized skeletal structures are unremarkable.
IMPRESSION: Mild bronchitic changes.
